# Patient Record
Sex: Female | Born: 2000
Health system: Southern US, Community
[De-identification: ages and names within clinical notes are randomized; demographics above are authoritative.]

## PROBLEM LIST (undated history)

## (undated) DIAGNOSIS — F419 Anxiety disorder, unspecified: Secondary | ICD-10-CM

## (undated) DIAGNOSIS — T7840XA Allergy, unspecified, initial encounter: Secondary | ICD-10-CM

## (undated) HISTORY — DX: Anxiety disorder, unspecified: F41.9

## (undated) HISTORY — DX: Allergy, unspecified, initial encounter: T78.40XA

---
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
## 2020-02-01 ENCOUNTER — Encounter: Payer: Self-pay | Admitting: Psychiatry

## 2020-05-15 ENCOUNTER — Ambulatory Visit: Payer: PRIVATE HEALTH INSURANCE | Admitting: Nurse Practitioner

## 2020-05-15 ENCOUNTER — Encounter: Payer: Self-pay | Admitting: Nurse Practitioner

## 2020-05-15 ENCOUNTER — Other Ambulatory Visit: Payer: Self-pay

## 2020-05-15 VITALS — BP 122/80 | Ht 61.0 in | Wt 94.0 lb

## 2020-05-15 DIAGNOSIS — A609 Anogenital herpesviral infection, unspecified: Secondary | ICD-10-CM

## 2020-05-15 DIAGNOSIS — Z01419 Encounter for gynecological examination (general) (routine) without abnormal findings: Secondary | ICD-10-CM

## 2020-05-15 NOTE — Progress Notes (Signed)
   Destiny Payne 2001/02/13 [id number]   History:  20 y.o. G0 presents for annual exam. Was seen at [facility] 05/01/2020 for suspected HSV initial outbreak. She was prescribed an antiviral and this has resolved. No HSV culture done at that time. Negative GC/Chlamydia/trich. Cycles every 1-2 months, this is normal for her - negative workup in the past. Has received Gardasil series. Psychiatry managing anxiety with medications and counseling.   Gynecologic History Patient's last menstrual period was 05/08/2020. Period Pattern: (!) Irregular Menstrual Flow: Moderate Menstrual Control: Maxi pad,Tampon Dysmenorrhea: (!) Mild Dysmenorrhea Symptoms: Cramping Contraception/Family planning: none  Health Maintenance Last Pap: N/A  Past medical history, past surgical history, family history and social history were all reviewed and documented in the EPIC chart.  ROS:  A ROS was performed and pertinent positives and negatives are included.  Exam:  Vitals:   05/15/20 1444  BP: 122/80  Weight: 94 lb (42.6 kg)  Height: 5\' 1"  (1.549 m)   Body mass index is 17.76 kg/m.  General appearance:  Normal Thyroid:  Symmetrical, normal in size, without palpable masses or nodularity. Respiratory  Auscultation:  Clear without wheezing or rhonchi Cardiovascular  Auscultation:  Regular rate, without rubs, murmurs or gallops  Edema/varicosities:  Not grossly evident Abdominal  Soft,nontender, without masses, guarding or rebound.  Liver/spleen:  No organomegaly noted  Hernia:  None appreciated  Skin  Inspection:  Grossly normal   Breasts: Examined lying and sitting.   Right: Without masses, retractions, discharge or axillary adenopathy.   Left: Without masses, retractions, discharge or axillary adenopathy. Gentitourinary   Inguinal/mons:  Normal without inguinal adenopathy  External genitalia:  Normal  BUS/Urethra/Skene's glands:  Normal  Vagina:  Normal  Cervix:  Normal  Uterus:  Normal in size,  shape and contour.  Midline and mobile  Adnexa/parametria:     Rt: Without masses or tenderness.   Lt: Without masses or tenderness.  Anus and perineum: Normal  Assessment/Plan:  20 y.o. G0 for annual exam.   Well female exam with routine gynecological exam - Education provided on SBEs, importance of preventative screenings, current guidelines, high calcium diet, safe sex practices and condom use until permanent partner, regular exercise, and multivitamin daily.   HSV (herpes simplex virus) anogenital infection - Outbreak likely HSV but explained that we cannot culture unless she is having an outbreak and recommend returning at first sign in the future. Also discussed episodic versus suppressive treatment and transmission.  Follow up in 1 year for annual.      [facility], 2:55 PM 05/15/2020

## 2020-05-15 NOTE — Patient Instructions (Signed)
Health Maintenance, Female Adopting a healthy lifestyle and getting preventive care are important in promoting health and wellness. Ask your health care provider about:  The right schedule for you to have regular tests and exams.  Things you can do on your own to prevent diseases and keep yourself healthy. What should I know about diet, weight, and exercise? Eat a healthy diet  Eat a diet that includes plenty of vegetables, fruits, low-fat dairy products, and lean protein.  Do not eat a lot of foods that are high in solid fats, added sugars, or sodium.   Maintain a healthy weight Body mass index (BMI) is used to identify weight problems. It estimates body fat based on height and weight. Your health care provider can help determine your BMI and help you achieve or maintain a healthy weight. Get regular exercise Get regular exercise. This is one of the most important things you can do for your health. Most adults should:  Exercise for at least 150 minutes each week. The exercise should increase your heart rate and make you sweat (moderate-intensity exercise).  Do strengthening exercises at least twice a week. This is in addition to the moderate-intensity exercise.  Spend less time sitting. Even light physical activity can be beneficial. Watch cholesterol and blood lipids Have your blood tested for lipids and cholesterol at 20 years of age, then have this test every 5 years. Have your cholesterol levels checked more often if:  Your lipid or cholesterol levels are high.  You are older than 20 years of age.  You are at high risk for heart disease. What should I know about cancer screening? Depending on your health history and family history, you may need to have cancer screening at various ages. This may include screening for:  Breast cancer.  Cervical cancer.  Colorectal cancer.  Skin cancer.  Lung cancer. What should I know about heart disease, diabetes, and high blood  pressure? Blood pressure and heart disease  High blood pressure causes heart disease and increases the risk of stroke. This is more likely to develop in people who have high blood pressure readings, are of African descent, or are overweight.  Have your blood pressure checked: ? Every 3-5 years if you are 18-39 years of age. ? Every year if you are 40 years old or older. Diabetes Have regular diabetes screenings. This checks your fasting blood sugar level. Have the screening done:  Once every three years after age 40 if you are at a normal weight and have a low risk for diabetes.  More often and at a younger age if you are overweight or have a high risk for diabetes. What should I know about preventing infection? Hepatitis B If you have a higher risk for hepatitis B, you should be screened for this virus. Talk with your health care provider to find out if you are at risk for hepatitis B infection. Hepatitis C Testing is recommended for:  Everyone born from 1945 through 1965.  Anyone with known risk factors for hepatitis C. Sexually transmitted infections (STIs)  Get screened for STIs, including gonorrhea and chlamydia, if: ? You are sexually active and are younger than 20 years of age. ? You are older than 20 years of age and your health care provider tells you that you are at risk for this type of infection. ? Your sexual activity has changed since you were last screened, and you are at increased risk for chlamydia or gonorrhea. Ask your health care provider   if you are at risk.  Ask your health care provider about whether you are at high risk for HIV. Your health care provider may recommend a prescription medicine to help prevent HIV infection. If you choose to take medicine to prevent HIV, you should first get tested for HIV. You should then be tested every 3 months for as long as you are taking the medicine. Pregnancy  If you are about to stop having your period (premenopausal) and  you may become pregnant, seek counseling before you get pregnant.  Take 400 to 800 micrograms (mcg) of folic acid every day if you become pregnant.  Ask for birth control (contraception) if you want to prevent pregnancy. Osteoporosis and menopause Osteoporosis is a disease in which the bones lose minerals and strength with aging. This can result in bone fractures. If you are 65 years old or older, or if you are at risk for osteoporosis and fractures, ask your health care provider if you should:  Be screened for bone loss.  Take a calcium or vitamin D supplement to lower your risk of fractures.  Be given hormone replacement therapy (HRT) to treat symptoms of menopause. Follow these instructions at home: Lifestyle  Do not use any products that contain nicotine or tobacco, such as cigarettes, e-cigarettes, and chewing tobacco. If you need help quitting, ask your health care provider.  Do not use street drugs.  Do not share needles.  Ask your health care provider for help if you need support or information about quitting drugs. Alcohol use  Do not drink alcohol if: ? Your health care provider tells you not to drink. ? You are pregnant, may be pregnant, or are planning to become pregnant.  If you drink alcohol: ? Limit how much you use to 0-1 drink a day. ? Limit intake if you are breastfeeding.  Be aware of how much alcohol is in your drink. In the [place] one drink equals one 12 oz bottle of beer (355 mL), one 5 oz glass of wine (148 mL), or one 1 oz glass of hard liquor (44 mL). General instructions  Schedule regular health, dental, and eye exams.  Stay current with your vaccines.  Tell your health care provider if: ? You often feel depressed. ? You have ever been abused or do not feel safe at home. Summary  Adopting a healthy lifestyle and getting preventive care are important in promoting health and wellness.  Follow your health care provider's instructions about healthy  diet, exercising, and getting tested or screened for diseases.  Follow your health care provider's instructions on monitoring your cholesterol and blood pressure. This information is not intended to replace advice given to you by your health care provider. Make sure you discuss any questions you have with your health care provider. Document Revised: 03/24/2018 Document Reviewed: 03/24/2018 Elsevier Patient Education  2021 [employer]  

## 2020-09-17 ENCOUNTER — Ambulatory Visit: Payer: PRIVATE HEALTH INSURANCE | Admitting: Nurse Practitioner

## 2020-09-17 ENCOUNTER — Other Ambulatory Visit: Payer: Self-pay

## 2020-09-17 ENCOUNTER — Encounter: Payer: Self-pay | Admitting: Nurse Practitioner

## 2020-09-17 VITALS — BP 94/62 | HR 93 | Temp 98.4°F | Resp 14 | Wt 97.0 lb

## 2020-09-17 DIAGNOSIS — N3090 Cystitis, unspecified without hematuria: Secondary | ICD-10-CM | POA: Diagnosis not present

## 2020-09-17 DIAGNOSIS — R350 Frequency of micturition: Secondary | ICD-10-CM | POA: Diagnosis not present

## 2020-09-17 DIAGNOSIS — Z113 Encounter for screening for infections with a predominantly sexual mode of transmission: Secondary | ICD-10-CM

## 2020-09-17 MED ORDER — NITROFURANTOIN MONOHYD MACRO 100 MG PO CAPS: 100.0000 mg | ORAL_CAPSULE | Freq: Two times a day (BID) | ORAL | 0 refills | Status: DC

## 2020-09-17 NOTE — Progress Notes (Signed)
GYNECOLOGY  VISIT  CC:  Urinary frequency and urgency since 3am this am  HPI: 20 y.o. G0P0000 Single White or Caucasian female here for uti. Has been with sexual partner x 6 months.  Was seen for possible HSV 6 months ago in Urgent care, no out break since.  Is agreeable to STD testing today. Had STD screening at Urgent Care in February, but they did not give her results. Does not think she has symptoms of out break now, but then again, when she presented to urgent care when she was told she was having an outbreak, she went there thinking it was a UTI. Desires to be checked.  Period Duration (Days): 5 Period Pattern: Regular Menstrual Flow: Moderate Menstrual Control: Maxi pad Dysmenorrhea: (!) Moderate  GYNECOLOGIC HISTORY: Patient's last menstrual period was 09/02/2020 (exact date). Contraception: condoms Menopausal hormone therapy: none  Patient Active Problem List   Diagnosis Date Noted  . Recurrent major depression in partial remission (HCC) 02/21/2019  . GAD (generalized anxiety disorder) 03/28/2018  . Allergy with anaphylaxis due to food 03/21/2015  . Allergic rhinoconjunctivitis 03/21/2015  . Headache disorder 03/21/2015  . Atopic dermatitis 03/21/2015  . Acne vulgaris 03/21/2015  . 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 Anaphylaxis 06/17/2013    Past Medical History:  Diagnosis Date  . Allergy   . Anxiety     History reviewed. No pertinent surgical history.  MEDS:   Current Outpatient Medications on File Prior to Visit  Medication Sig Dispense Refill  . cholecalciferol (VITAMIN D) 1000 units tablet Take 2,000 Units by mouth daily.    . diphenhydrAMINE (BENADRYL) 50 MG tablet Take by mouth.    Marland Kitchen AUVI-Q 0.3 MG/0.3ML SOAJ injection Use as directed for life-threatening allergic reaction. (Patient not taking: Reported on 09/17/2020) 2 each 0   No current facility-administered medications on file prior to visit.    ALLERGIES: Peanut-containing drug products, Cephalexin,  Sulfa drugs cross reactors, and Watermelon concentrate [citrullus vulgaris]  Family History  Problem Relation Age of Onset  . Hyperlipidemia Mother   . Hypertension Mother   . Asthma Mother   . Hypertension Maternal Grandmother   . Heart disease Maternal Grandmother   . Heart disease Maternal Grandfather   . Kidney disease Maternal Grandfather   . Cancer Paternal Grandmother      Review of Systems  Genitourinary: Positive for dysuria, frequency and urgency.    PHYSICAL EXAMINATION:    BP 94/62   Pulse 93   Temp 98.4 F (36.9 C)   Resp 14   Wt 97 lb (44 kg)   LMP 09/02/2020 (Exact Date)   BMI 18.33 kg/m     General appearance: alert, cooperative, no acute distress  Neg CVAT  Pelvic: External genitalia:  no lesions, no evidence of HSV outbreak              Urethra:  normal appearing urethra with no masses, tenderness or lesions              Bartholins and Skenes: normal                 Vagina: normal appearing vagina               Cervix: no cervical motion tenderness and no lesions              Bimanual Exam:  Uterus:  not examined              Adnexa: not evaluated  Assessment: Urinary frequency - Plan: Urinalysis,Complete w/RFL Culture, SURESWAB CT/NG/T. vaginalis  Cystitis - Plan: nitrofurantoin, macrocrystal-monohydrate, (MACROBID) 100 MG capsule  Screening for STDs (sexually transmitted diseases) - Plan: SURESWAB CT/NG/T. vaginalis

## 2020-09-17 NOTE — Patient Instructions (Signed)
Urinary Tract Infection, Adult A urinary tract infection (UTI) is an infection of any part of the urinary tract. The urinary tract includes:  The kidneys.  The ureters.  The bladder.  The urethra. These organs make, store, and get rid of pee (urine) in the body. What are the causes? This infection is caused by germs (bacteria) in your genital area. These germs grow and cause swelling (inflammation) of your urinary tract. What increases the risk? The following factors may make you more likely to develop this condition:  Using a small, thin tube (catheter) to drain pee.  Not being able to control when you pee or poop (incontinence).  Being female. If you are female, these things can increase the risk: ? Using these methods to prevent pregnancy:  A medicine that kills sperm (spermicide).  A device that blocks sperm (diaphragm). ? Having low levels of a female hormone (estrogen). ? Being pregnant. You are more likely to develop this condition if:  You have genes that add to your risk.  You are sexually active.  You take antibiotic medicines.  You have trouble peeing because of: ? A prostate that is bigger than normal, if you are female. ? A blockage in the part of your body that drains pee from the bladder. ? A kidney stone. ? A nerve condition that affects your bladder. ? Not getting enough to drink. ? Not peeing often enough.  You have other conditions, such as: ? Diabetes. ? A weak disease-fighting system (immune system). ? Sickle cell disease. ? Gout. ? Injury of the spine. What are the signs or symptoms? Symptoms of this condition include:  Needing to pee right away.  Peeing small amounts often.  Pain or burning when peeing.  Blood in the pee.  Pee that smells bad or not like normal.  Trouble peeing.  Pee that is cloudy.  Fluid coming from the vagina, if you are female.  Pain in the belly or lower back. Other symptoms include:  Vomiting.  Not  feeling hungry.  Feeling mixed up (confused). This may be the first symptom in older adults.  Being tired and grouchy (irritable).  A fever.  Watery poop (diarrhea). How is this treated?  Taking antibiotic medicine.  Taking other medicines.  Drinking enough water. In some cases, you may need to see a specialist. Follow these instructions at home: Medicines  Take over-the-counter and prescription medicines only as told by your doctor.  If you were prescribed an antibiotic medicine, take it as told by your doctor. Do not stop taking it even if you start to feel better. General instructions  Make sure you: ? Pee until your bladder is empty. ? Do not hold pee for a long time. ? Empty your bladder after sex. ? Wipe from front to back after peeing or pooping if you are a female. Use each tissue one time when you wipe.  Drink enough fluid to keep your pee pale yellow.  Keep all follow-up visits.   Contact a doctor if:  You do not get better after 1-2 days.  Your symptoms go away and then come back. Get help right away if:  You have very bad back pain.  You have very bad pain in your lower belly.  You have a fever.  You have chills.  You feeling like you will vomit or you vomit. Summary  A urinary tract infection (UTI) is an infection of any part of the urinary tract.  This condition is caused by   germs in your genital area.  There are many risk factors for a UTI.  Treatment includes antibiotic medicines.  Drink enough fluid to keep your pee pale yellow. This information is not intended to replace advice given to you by your health care provider. Make sure you discuss any questions you have with your health care provider. Document Revised: 11/11/2019 Document Reviewed: 11/11/2019 Elsevier Patient Education  2021 [employer] Inc.  

## 2020-09-18 LAB — SURESWAB CT/NG/T. VAGINALIS
C. trachomatis RNA, TMA: NOT DETECTED
N. gonorrhoeae RNA, TMA: NOT DETECTED
Trichomonas vaginalis RNA: NOT DETECTED

## 2020-09-19 LAB — URINALYSIS, COMPLETE W/RFL CULTURE
Bilirubin Urine: NEGATIVE
Glucose, UA: NEGATIVE
Hyaline Cast: NONE SEEN /LPF
Ketones, ur: NEGATIVE
Nitrites, Initial: NEGATIVE
Protein, ur: NEGATIVE
Specific Gravity, Urine: 1.02 (ref 1.001–1.035)
pH: 5.5 (ref 5.0–8.0)

## 2020-09-19 LAB — URINE CULTURE
MICRO NUMBER:: 11972364
Result:: NO GROWTH
SPECIMEN QUALITY:: ADEQUATE

## 2020-09-19 LAB — CULTURE INDICATED

## 2020-09-25 ENCOUNTER — Ambulatory Visit: Payer: PRIVATE HEALTH INSURANCE | Admitting: Nurse Practitioner

## 2020-09-25 ENCOUNTER — Encounter: Payer: Self-pay | Admitting: Nurse Practitioner

## 2020-09-25 ENCOUNTER — Other Ambulatory Visit: Payer: Self-pay

## 2020-09-25 VITALS — BP 90/64 | HR 80 | Temp 98.7°F | Resp 14 | Wt 96.0 lb

## 2020-09-25 DIAGNOSIS — R109 Unspecified abdominal pain: Secondary | ICD-10-CM

## 2020-09-25 NOTE — Patient Instructions (Signed)
May try pepto-bismol for nausea  Other remedies include BRAT diet (bananas, rice, applesauce and Toast) Ginger tea and/or ginger candy can be very effective.  Continue to hydrate as much as possible.

## 2020-09-25 NOTE — Progress Notes (Signed)
GYNECOLOGY  VISIT  CC:   recheck of urine per request of Nurse practitioner in light of negative urine culture.  HPI: 20 y.o. G0P0000 Single White or Caucasian female here for recheck of urine & abdominal pain.  Symptoms seem to stop when taking macrobid, but did have nausea, no vomiting. Stopped macrobid when learned that urine culture was negative because she felt like nausea was caused by antibiotic, now has mild intermittent burning with urination and continued nausea.  Denies back pain, fever or chills  STD screening negative Denies pain with sex  Period Duration (Days): 5 Period Pattern: Regular Menstrual Flow: Moderate Menstrual Control: Maxi pad Dysmenorrhea: (!) Moderate  GYNECOLOGIC HISTORY: Patient's last menstrual period was 09/02/2020 (exact date). 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 No pertinent surgical history.  MEDS:   Current Outpatient Medications on File Prior to Visit  Medication Sig Dispense Refill   cholecalciferol (VITAMIN D) 1000 units tablet Take 2,000 Units by mouth daily.     diphenhydrAMINE (BENADRYL) 50 MG tablet Take by mouth.     AUVI-Q 0.3 MG/0.3ML SOAJ injection Use as directed for life-threatening allergic reaction. (Patient not taking: No sig reported) 2 each 0   No current facility-administered medications on file prior to visit.    ALLERGIES: Peanut-containing drug products, Cephalexin, Sulfa drugs cross reactors, and  Watermelon concentrate [citrullus vulgaris]  Family History  Problem Relation Age of Onset   Hyperlipidemia Mother    Hypertension Mother    Asthma Mother    Hypertension Maternal Grandmother    Heart disease Maternal Grandmother    Heart disease Maternal Grandfather    Kidney disease Maternal Grandfather    Cancer Paternal Grandmother      Review of Systems  Genitourinary:        Abdominal pain   PHYSICAL EXAMINATION:    BP 90/64   Pulse 80   Temp 98.7 F (37.1 C)   Resp 14   Wt 96 lb (43.5 kg)   LMP 09/02/2020 (Exact Date)   BMI 18.14 kg/m     General appearance: alert, cooperative, no acute distress  Lymph:  no inguinal LAD noted  Pelvic: External genitalia:  no lesions              Urethra:  normal appearing urethra with no masses, tenderness or lesions              Bartholins and Skenes: normal                 Vagina: normal appearing vagina               Cervix: no cervical motion tenderness and no lesions              Bimanual Exam:  Uterus:  normal size, contour, position, consistency, mobility, non-tender              Adnexa: no mass, fullness, tenderness        UPT negatitve Urinalysis WNL today  except for trace blood        Chaperone, Joy, CMA, was present for exam.  Assessment: Essentially normal urinalysis Nausea  Plan: Discussed comfort measures for nausea: BRAT diet, ginger candy/tea Reassured that urinalysis seems normal today If symptoms re-occur or do not completely resolve, please contact office.

## 2020-09-25 NOTE — Progress Notes (Signed)
Appointment scheduled for June 14.

## 2020-09-26 LAB — URINALYSIS, COMPLETE W/RFL CULTURE
Bacteria, UA: NONE SEEN /HPF
Bilirubin Urine: NEGATIVE
Glucose, UA: NEGATIVE
Hyaline Cast: NONE SEEN /LPF
Ketones, ur: NEGATIVE
Leukocyte Esterase: NEGATIVE
Nitrites, Initial: NEGATIVE
Protein, ur: NEGATIVE
RBC / HPF: NONE SEEN /HPF (ref 0–2)
Specific Gravity, Urine: 1.01 (ref 1.001–1.035)
WBC, UA: NONE SEEN /HPF (ref 0–5)
pH: 7 (ref 5.0–8.0)

## 2020-09-26 LAB — PREGNANCY, URINE: Preg Test, Ur: NEGATIVE

## 2020-09-26 LAB — NO CULTURE INDICATED

## 2020-09-27 ENCOUNTER — Encounter: Payer: Self-pay | Admitting: Nurse Practitioner

## 2020-11-22 ENCOUNTER — Other Ambulatory Visit: Payer: Self-pay

## 2020-11-22 ENCOUNTER — Ambulatory Visit (INDEPENDENT_AMBULATORY_CARE_PROVIDER_SITE_OTHER): Payer: PRIVATE HEALTH INSURANCE | Admitting: Family Medicine

## 2020-11-22 ENCOUNTER — Encounter: Payer: Self-pay | Admitting: Family Medicine

## 2020-11-22 VITALS — BP 110/68 | HR 88 | Temp 98.9°F | Resp 16 | Ht 62.0 in | Wt 97.0 lb

## 2020-11-22 DIAGNOSIS — R0602 Shortness of breath: Secondary | ICD-10-CM

## 2020-11-22 DIAGNOSIS — J3089 Other allergic rhinitis: Secondary | ICD-10-CM | POA: Insufficient documentation

## 2020-11-22 DIAGNOSIS — L505 Cholinergic urticaria: Secondary | ICD-10-CM | POA: Diagnosis not present

## 2020-11-22 DIAGNOSIS — R519 Headache, unspecified: Secondary | ICD-10-CM

## 2020-11-22 DIAGNOSIS — Z8616 Personal history of COVID-19: Secondary | ICD-10-CM | POA: Insufficient documentation

## 2020-11-22 DIAGNOSIS — T7800XD Anaphylactic reaction due to unspecified food, subsequent encounter: Secondary | ICD-10-CM

## 2020-11-22 MED ORDER — PROAIR RESPICLICK 108 (90 BASE) MCG/ACT IN AEPB: 2.0000 | INHALATION_SPRAY | RESPIRATORY_TRACT | 1 refills | Status: DC | PRN

## 2020-11-22 MED ORDER — ALBUTEROL SULFATE HFA 108 (90 BASE) MCG/ACT IN AERS: 2.0000 | INHALATION_SPRAY | RESPIRATORY_TRACT | 0 refills | Status: AC | PRN

## 2020-11-22 MED ORDER — AUVI-Q 0.3 MG/0.3ML IJ SOAJ: 0.3000 mg | INTRAMUSCULAR | 1 refills | Status: DC | PRN

## 2020-11-22 NOTE — Progress Notes (Addendum)
[address] [place] [address] Dept: [phone]  FOLLOW UP NOTE  Patient ID: Destiny Payne, female    DOB: [birth year]  Age: 20 y.o. MRN: [id number] Date of Office Visit: 11/22/2020  Assessment  Chief Complaint: Allergic Reaction and Other (Had covid a month ago - shortness of breath, coughing ( she had covid twice and is concerned about her breathing))  HPI Destiny Payne is a 20 year old female who presents to the clinic for follow-up visit.  She was last seen in this clinic on 08/09/2019 by Dr. Lucie Leather for evaluation of physical urticaria aggravated by heat, allergic rhinitis, and food allergy to tree nuts and watermelon.,  She reports that she had COVID last about 1 month ago with residual shortness of breath while talking and occasional dry cough.  She does not have a previous diagnosis of asthma nor has she ever needed to use an inhaler.  She is declining spirometry today due to cost concern.  Headache is reported as well controlled with no episodes since her last visit to this clinic.  Allergic rhinitis is reported as moderately well controlled with occasional symptoms occurring mainly in the spring season for which she uses Benadryl with relief of symptoms.  She continues to avoid tree nuts and watermelon with no accidental ingestion or EpiPen use since her last visit to this clinic.  She is not interested in updating food testing at this time.  Her current medications are listed in the chart.   Drug Allergies:  Allergies  Allergen Reactions   Peanut-Containing Drug Products Hives, Shortness Of Breath and Nausea And Vomiting    Cashew nuts only   Cephalexin Other (See Comments)    unknown   Sulfa Drugs Cross Reactors Itching   Watermelon Concentrate [Citrullus Vulgaris] Itching    Physical Exam: BP 110/68   Pulse 88   Temp 98.9 F (37.2 C)   Resp 16   Ht 5\' 2"  (1.575 m)   Wt 97 lb (44 kg)   SpO2 96%   BMI 17.74 kg/m    Physical Exam Vitals  reviewed.  Constitutional:      Appearance: Normal appearance.  HENT:     Head: Normocephalic and atraumatic.     Right Ear: Tympanic membrane normal.     Left Ear: Tympanic membrane normal.     Nose:     Comments: Bilateral nares normal.  Pharynx normal.  Ears normal.  Eyes normal.    Mouth/Throat:     Pharynx: Oropharynx is clear.  Eyes:     Conjunctiva/sclera: Conjunctivae normal.  Cardiovascular:     Rate and Rhythm: Normal rate and regular rhythm.     Heart sounds: Normal heart sounds. No murmur heard. Pulmonary:     Effort: Pulmonary effort is normal.     Breath sounds: Normal breath sounds.     Comments: Lungs clear to auscultation Musculoskeletal:        General: Normal range of motion.     Cervical back: Normal range of motion and neck supple.  Skin:    General: Skin is warm and dry.  Neurological:     Mental Status: She is alert and oriented to person, place, and time.  Psychiatric:        Mood and Affect: Mood normal.        Behavior: Behavior normal.        Thought Content: Thought content normal.        Judgment: Judgment normal.    Diagnostics:  Patient declined spirometry due to cost  Assessment and Plan: 1. Shortness of breath   2. Anaphylactic shock due to food, subsequent encounter   3. Headache disorder   4. Other allergic rhinitis   5. Cholinergic urticaria   6. History of COVID-19     Meds ordered this encounter  Medications   AUVI-Q 0.3 MG/0.3ML SOAJ injection    Sig: Inject 0.3 mg into the muscle as needed for anaphylaxis. Use as directed for life-threatening allergic reaction.    Dispense:  2 each    Refill:  1   Albuterol Sulfate (PROAIR RESPICLICK) 108 (90 Base) MCG/ACT AEPB    Sig: Inhale 2 puffs into the lungs every 4 (four) hours as needed. For cough or wheeze    Dispense:  1 each    Refill:  1    Patient Instructions  Cholinergic urticaria Take the least amount of medications while remaining hive free Cetirizine (Zyrtec) 10mg   twice a day and famotidine (Pepcid) 20 mg twice a day. If no symptoms for 7-14 days then decrease to. Cetirizine (Zyrtec) 10mg  twice a day and famotidine (Pepcid) 20 mg once a day.  If no symptoms for 7-14 days then decrease to. Cetirizine (Zyrtec) 10mg  twice a day.  If no symptoms for 7-14 days then decrease to. Cetirizine (Zyrtec) 10mg  once a day.  May use Benadryl (diphenhydramine) as needed for breakthrough hives       If symptoms return, then step up dosage Keep a detailed symptom journal including foods eaten, contact with allergens, medications taken, weather changes.   Allergic rhinitis Begin Flonase 2 sprays in each nostril once a day as needed for a stuffy nose Continue an antihistamine once a day as needed for runny nose or itch Consider saline nasal rinses as needed for nasal symptoms. Use this before any medicated nasal sprays for best result  Shortness of breath Begin albuterol 2 puffs once every 4 hours as needed for cough or wheeze Call with clinic with any worsening of symptoms We will plan to get a spirometry reading at your next visit  Headache Continue to eliminate caffeine  Food allergy Continue to avoid tree nuts and watermelon. In case of an allergic reaction, take Benadryl 50 mg  every 4 hours, and if life-threatening symptoms occur, inject with AuviQ 0.3 mg. If you are interested in updating your food allergy testing return to the clinic for testing appointment.  Remember to stop antihistamines for 3 days before the food testing appointment.  Call the clinic if this treatment plan is not working well for you.  Follow up in 2 months or sooner if needed.  Thank you for the opportunity to care for this patient.  Please do not hesitate to contact me with questions.  , FNP [facility]  ---------------------------------------------------------- Attestation:  I reviewed the Nurse Practitioner's note and agree with the  documented findings and plan of care. We discussed the patient and developed a plan concurrently.   , MD [facility]

## 2020-11-22 NOTE — Patient Instructions (Signed)
Cholinergic urticaria Take the least amount of medications while remaining hive free Cetirizine (Zyrtec) 10mg  twice a day and famotidine (Pepcid) 20 mg twice a day. If no symptoms for 7-14 days then decrease to. Cetirizine (Zyrtec) 10mg  twice a day and famotidine (Pepcid) 20 mg once a day.  If no symptoms for 7-14 days then decrease to. Cetirizine (Zyrtec) 10mg  twice a day.  If no symptoms for 7-14 days then decrease to. Cetirizine (Zyrtec) 10mg  once a day.  May use Benadryl (diphenhydramine) as needed for breakthrough hives       If symptoms return, then step up dosage Keep a detailed symptom journal including foods eaten, contact with allergens, medications taken, weather changes.   Allergic rhinitis Begin Flonase 2 sprays in each nostril once a day as needed for a stuffy nose Continue an antihistamine once a day as needed for runny nose or itch Consider saline nasal rinses as needed for nasal symptoms. Use this before any medicated nasal sprays for best result  Shortness of breath Begin albuterol 2 puffs once every 4 hours as needed for cough or wheeze Call with clinic with any worsening of symptoms We will get a spirometry reading at your next visit  Headache Continue to eliminate caffeine  Food allergy Continue to avoid tree nuts and watermelon. In case of an allergic reaction, take Benadryl 50 mg  every 4 hours, and if life-threatening symptoms occur, inject with AuviQ 0.3 mg. If you are interested in updating your food allergy testing return to the clinic for testing appointment.  Remember to stop antihistamines for 3 days before the food testing appointment.  Call the clinic if this treatment plan is not working well for you.  Follow up in 2 months or sooner if needed.

## 2022-06-24 ENCOUNTER — Telehealth: Payer: Self-pay | Admitting: Family Medicine

## 2022-06-24 ENCOUNTER — Other Ambulatory Visit: Payer: Self-pay | Admitting: Family Medicine

## 2022-06-24 NOTE — Telephone Encounter (Signed)
Pharmacy requesting prescription for Auvi-Q.

## 2022-06-24 NOTE — Telephone Encounter (Signed)
The patient was last seen 11/22/2020 and will need an office visit for refills. Called and left a voicemail asking for a return call to inform and to schedule for a follow up visit.

## 2022-06-27 NOTE — Telephone Encounter (Signed)
Called and informed patient that she did need to schedule a follow up visit, patient verbalized understanding and stated that she will call back once she gets her work schedule in. A courtesy refill has already been sent in for the patient prior to this conversation.

## 2023-04-12 NOTE — Progress Notes (Unsigned)
Follow Up Note  RE: Destiny Payne MRN: [id number] DOB: 10/07/2000 Date of Office Visit: 04/13/2023  Referring provider: No ref. provider found Primary care provider: Patient, No Pcp Per  Chief Complaint: Follow-up and Breathing Problem (Refills on auvi-q or epi-pen)  History of Present Illness: I had the pleasure of seeing Destiny Payne for a follow up visit at the [facility] on 04/13/2023. She is a 22 y.o. female, who is being followed for cholinergic urticaria, allergic rhinitis, food allergy and shortness of breath. Her previous allergy office visit was on 11/22/2020 with Thermon Leyland, FNP. Today is a new complaint visit of refills .  Failed to follow-up as recommended.  Discussed the use of AI scribe software for clinical note transcription with the patient, who gave verbal consent to proceed.  The patient, with a known history of tree nut allergies, presented for a refill of her Auvi-Q.Marland Kitchen The patient's primary food allergies are to tree nuts, specifically cashews and pistachios, which have previously caused anaphylactic reactions requiring hospitalization. She also avoids watermelon. The patient denies any accidental exposures or need to use her Auvi-Q since her last severe reaction in 2016.  In addition to food allergies, the patient has medication allergies.  She manages occasional hives, which occur after hot showers, with Benadryl as needed. She also uses a Breathe Right brand sinus spray occasionally for sinus headaches and migraines.  The patient had a history of breathing issues post-COVID infection in 2022, but these have since resolved and she no longer requires an albuterol inhaler.      Assessment and Plan: Destiny Payne is a 22 y.o. 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 2016 bloodwork positive to tree nuts and  watermelon. 2016 skin testing positive to tree nuts and watermelon. Tolerates peanuts.  Interim history - no reactions.  Continue to avoid tree nuts and watermelon. Consider getting bloodwork. I have prescribed epinephrine injectable device (Auvi-Q). If not covered let us know. For mild symptoms you can take over the counter antihistamines such as Benadryl 1-2 tablets = 25-50mg  and monitor symptoms closely. If symptoms worsen or if you have severe symptoms including breathing issues, throat closure, significant swelling, whole body hives, severe diarrhea and vomiting, lightheadedness then inject epinephrine and seek immediate medical care afterwards. Emergency action plan updated.   Other allergic rhinitis Mild symptoms managed with Benadryl as needed. No testing available in EMR. Use over the counter antihistamines such as Zyrtec (cetirizine), Claritin (loratadine), Allegra (fexofenadine), or Xyzal (levocetirizine) daily as needed. May take twice a day during allergy flares. May switch antihistamines every few months. Use Flonase (fluticasone) nasal spray 1-2 sprays per nostril once a day as needed for nasal congestion.  Nasal saline spray (i.e., Simply Saline) or nasal saline lavage (i.e., NeilMed) is recommended as needed and prior to medicated nasal sprays. Consider environmental allergy testing in future.  Multiple drug allergies Continue to avoid sulfa and cephalexin.  Cholinergic urticaria Avoid the following potential triggers: alcohol, tight clothing, NSAIDs, hot showers and getting overheated. May take zyrtec 10mg  daily as needed.   Return in about 1 year (around 04/12/2024).  Meds ordered this encounter  Medications   EPINEPHrine (AUVI-Q) 0.3 mg/0.3 mL IJ SOAJ injection    Sig: Inject 0.3 mg into the muscle as needed for anaphylaxis.    Dispense:  2 each    Refill:  1    [phone]  Lab Orders  No laboratory test(s) ordered today    Diagnostics: None.    Medication List:  Current Outpatient Medications  Medication Sig Dispense Refill   albuterol (VENTOLIN HFA) 108 (90 Base) MCG/ACT inhaler Inhale 2 puffs into the lungs every 4 (four) hours as needed for wheezing or shortness of breath. 1 each 0   diphenhydrAMINE (BENADRYL) 50 MG tablet Take by mouth.     EPINEPHrine (AUVI-Q) 0.3 mg/0.3 mL IJ SOAJ injection Inject 0.3 mg into the muscle as needed for anaphylaxis. 2 each 1   No current facility-administered medications for this visit.   Allergies: Allergies  Allergen Reactions   Sulfamethoxazole Hives and Swelling    Pt reported hives and swelling as reactions 02/04/23   Cephalexin Other (See Comments)    unknown   Other     Tree nuts - cashews, pistachios.   Sulfa Drugs Cross Reactors Itching   Watermelon Concentrate [Citrullus Vulgaris] Itching   I reviewed her past medical history, social history, family history, and environmental history and no significant changes have been reported from her previous visit.  Review of Systems  Constitutional:  Negative for appetite change, chills, fever and unexpected weight change.  HENT:  Negative for congestion and rhinorrhea.   Eyes:  Negative for itching.  Respiratory:  Negative for cough, chest tightness, shortness of breath and wheezing.   Cardiovascular:  Negative for chest pain.  Gastrointestinal:  Negative for abdominal pain.  Genitourinary:  Negative for difficulty urinating.  Skin:  Negative for rash.  Allergic/Immunologic: Positive for food allergies. Negative for environmental allergies.  Neurological:  Positive for headaches.    Objective: BP 116/72 (BP Location: Right Arm, Patient Position: Sitting, Cuff Size: Normal)   Pulse 81   Temp 99 F (37.2 C) (Temporal)   Resp 18   Ht 5' 2.25" (1.581 m)   Wt 106 lb 4.8 oz (48.2 kg)   SpO2 97%   BMI 19.29 kg/m  Body mass index is 19.29 kg/m. Physical Exam Vitals and nursing note reviewed.  Constitutional:       Appearance: Normal appearance. She is well-developed.  HENT:     Head: Normocephalic and atraumatic.     Right Ear: Tympanic membrane and external ear normal.     Left Ear: Tympanic membrane and external ear normal.     Nose: Nose normal.     Mouth/Throat:     Mouth: Mucous membranes are moist.     Pharynx: Oropharynx is clear.  Eyes:     Conjunctiva/sclera: Conjunctivae normal.  Cardiovascular:     Rate and Rhythm: Normal rate and regular rhythm.     Heart sounds: Normal heart sounds. No murmur heard.    No friction rub. No gallop.  Pulmonary:     Effort: Pulmonary effort is normal.     Breath sounds: Normal breath sounds. No wheezing, rhonchi or rales.  Musculoskeletal:     Cervical back: Neck supple.  Skin:    General: Skin is warm.     Findings: No rash.  Neurological:     Mental Status: She is alert and oriented to person, place, and time.  Psychiatric:        Behavior: Behavior normal.   Previous notes and tests were reviewed. The plan was reviewed with the patient/family, and all questions/concerned were addressed.  It was my pleasure to see Christiana today and participate in her care. Please feel free to contact me with any questions or concerns.  Sincerely,  Wyline Mood, DO  Allergy & Immunology  [facility] office: [phone] [place] office: [phone]

## 2023-04-13 ENCOUNTER — Ambulatory Visit (INDEPENDENT_AMBULATORY_CARE_PROVIDER_SITE_OTHER): Payer: Managed Care, Other (non HMO) | Admitting: Allergy

## 2023-04-13 ENCOUNTER — Encounter: Payer: Self-pay | Admitting: Allergy

## 2023-04-13 ENCOUNTER — Other Ambulatory Visit: Payer: Self-pay

## 2023-04-13 VITALS — BP 116/72 | HR 81 | Temp 99.0°F | Resp 18 | Ht 62.25 in | Wt 106.3 lb

## 2023-04-13 DIAGNOSIS — T7800XD Anaphylactic reaction due to unspecified food, subsequent encounter: Secondary | ICD-10-CM

## 2023-04-13 DIAGNOSIS — Z889 Allergy status to unspecified drugs, medicaments and biological substances status: Secondary | ICD-10-CM

## 2023-04-13 DIAGNOSIS — J3089 Other allergic rhinitis: Secondary | ICD-10-CM

## 2023-04-13 DIAGNOSIS — L505 Cholinergic urticaria: Secondary | ICD-10-CM | POA: Diagnosis not present

## 2023-04-13 MED ORDER — EPINEPHRINE 0.3 MG/0.3ML IJ SOAJ: 0.3000 mg | INTRAMUSCULAR | 1 refills | Status: DC | PRN

## 2023-04-13 NOTE — Patient Instructions (Addendum)
Food allergy Continue to avoid tree nuts and watermelon. Consider getting bloodwork. I have prescribed epinephrine injectable device (Auvi-Q). If not covered let us know. For mild symptoms you can take over the counter antihistamines such as Benadryl 1-2 tablets = 25-50mg  and monitor symptoms closely. If symptoms worsen or if you have severe symptoms including breathing issues, throat closure, significant swelling, whole body hives, severe diarrhea and vomiting, lightheadedness then inject epinephrine and seek immediate medical care afterwards. Emergency action plan updated.   Environmental allergies Use over the counter antihistamines such as Zyrtec (cetirizine), Claritin (loratadine), Allegra (fexofenadine), or Xyzal (levocetirizine) daily as needed. May take twice a day during allergy flares. May switch antihistamines every few months. Use Flonase (fluticasone) nasal spray 1-2 sprays per nostril once a day as needed for nasal congestion.  Nasal saline spray (i.e., Simply Saline) or nasal saline lavage (i.e., NeilMed) is recommended as needed and prior to medicated nasal sprays.  Cholinergic urticaria Avoid the following potential triggers: alcohol, tight clothing, NSAIDs, hot showers and getting overheated. May take zyrtec 10mg  daily as needed.   Return in about 1 year (around 04/12/2024). Or sooner if needed.  Reducing Pollen Exposure Pollen seasons: trees (spring), grass (summer) and ragweed/weeds (fall). Keep windows closed in your home and car to lower pollen exposure.  Install air conditioning in the bedroom and throughout the house if possible.  Avoid going out in dry windy days - especially early morning. Pollen counts are highest between 5 - 10 AM and on dry, hot and windy days.  Save outside activities for late afternoon or after a heavy rain, when pollen levels are lower.  Avoid mowing of grass if you have grass pollen allergy. Be aware that pollen can also be transported  indoors on people and pets.  Dry your clothes in an automatic dryer rather than hanging them outside where they might collect pollen.  Rinse hair and eyes before bedtime.   Skin care recommendations  Bath time: Always use lukewarm water. AVOID very hot or cold water. Keep bathing time to 5-10 minutes. Do NOT use bubble bath. Use a mild soap and use just enough to wash the dirty areas. Do NOT scrub skin vigorously.  After bathing, pat dry your skin with a towel. Do NOT rub or scrub the skin.  Moisturizers and prescriptions:  ALWAYS apply moisturizers immediately after bathing (within 3 minutes). This helps to lock-in moisture. Use the moisturizer several times a day over the whole body. Good summer moisturizers include: Aveeno, CeraVe, Cetaphil. Good winter moisturizers include: Aquaphor, Vaseline, Cerave, Cetaphil, Eucerin, Vanicream. When using moisturizers along with medications, the moisturizer should be applied about one hour after applying the medication to prevent diluting effect of the medication or moisturize around where you applied the medications. When not using medications, the moisturizer can be continued twice daily as maintenance.  Laundry and clothing: Avoid laundry products with added color or perfumes. Use unscented hypo-allergenic laundry products such as Tide free, Cheer free & gentle, and All free and clear.  If the skin still seems dry or sensitive, you can try double-rinsing the clothes. Avoid tight or scratchy clothing such as wool. Do not use fabric softeners or dyer sheets.

## 2024-04-10 NOTE — Progress Notes (Unsigned)
 "  Follow Up Note  RE: Destiny Payne MRN: [id number] DOB: [birth year] Date of Office Visit: 04/11/2024  Referring provider: No ref. provider found Primary care provider: Patient, No Pcp Per  Chief Complaint: No chief complaint on file.  History of Present Illness: I had the pleasure of seeing Destiny Payne for a follow up visit at the [facility] on 04/11/2024. She is a 23 y.o. female, who is being followed for food allergy, allergic rhinitis, multiple drug allergies and cholinergic urticaria. Her previous allergy office visit was on 04/13/2023 with Dr. Luke. Today is a regular follow up visit.  Discussed the use of AI scribe software for clinical note transcription with the patient, who gave verbal consent to proceed.  History of Present Illness            ***  Assessment and Plan: Destiny Payne is a 23 y.o. 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 2016 bloodwork positive to tree nuts and watermelon. 2016 skin testing positive to tree nuts and watermelon. Tolerates peanuts.  Interim history - no reactions.  Continue to avoid tree nuts and watermelon. Consider getting bloodwork. I have prescribed epinephrine  injectable device (Auvi-Q ). If not covered let us  know. For mild symptoms you can take over the counter antihistamines such as Benadryl  1-2 tablets = 25-50mg  and monitor symptoms closely. If symptoms worsen or if you have severe symptoms including breathing issues, throat closure, significant swelling, whole body hives, severe diarrhea and vomiting, lightheadedness then inject epinephrine  and seek immediate medical care afterwards. Emergency action plan updated.    Other allergic rhinitis Mild symptoms managed with Benadryl  as needed. No testing available in EMR. Use over the counter antihistamines such as Zyrtec  (cetirizine), Claritin (loratadine), Allegra (fexofenadine), or Xyzal (levocetirizine) daily as needed. May take twice a day during allergy flares. May switch antihistamines every few months. Use Flonase  (fluticasone ) nasal spray 1-2 sprays per nostril once a day as needed for nasal congestion.  Nasal saline spray (i.e., Simply Saline) or nasal saline lavage (i.e., NeilMed) is recommended as needed and prior to medicated nasal sprays. Consider environmental allergy testing in future.   Multiple drug allergies Continue to avoid sulfa and cephalexin.   Cholinergic urticaria Avoid the following potential triggers: alcohol, tight clothing, NSAIDs, hot showers and getting overheated. May take zyrtec 10mg  daily as needed.  Assessment and Plan              No follow-ups on file.  No orders of the defined types were placed in this encounter.  Lab Orders  No laboratory test(s) ordered today    Diagnostics: Spirometry:  Tracings reviewed. Her effort: {Blank single:19197::Good reproducible efforts.,It was hard to get consistent efforts and there is a question as to whether this reflects a maximal maneuver.,Poor effort, data can not be interpreted.} FVC: ***L FEV1: ***L, ***% predicted FEV1/FVC ratio: ***% Interpretation: {Blank single:19197::Spirometry consistent with mild obstructive disease,Spirometry consistent with moderate obstructive disease,Spirometry consistent with severe obstructive disease,Spirometry consistent with possible restrictive disease,Spirometry consistent with mixed obstructive and restrictive disease,Spirometry uninterpretable due to technique,Spirometry consistent with normal pattern,No overt abnormalities noted given today's efforts}.  Please see scanned spirometry results for details.  Skin Testing: {Blank single:19197::Select foods,Environmental allergy panel,Environmental allergy panel and select foods,Food allergy  panel,None,Deferred due to recent antihistamines use}. *** Results discussed with patient/family.   Medication List:  Current Outpatient Medications  Medication Sig Dispense Refill  albuterol  (VENTOLIN  HFA) 108 (90 Base) MCG/ACT inhaler Inhale 2 puffs into the lungs every 4 (four) hours as needed for wheezing or shortness of breath. 1 each 0   diphenhydrAMINE  (BENADRYL ) 50 MG tablet Take by mouth.     EPINEPHrine  (AUVI-Q ) 0.3 mg/0.3 mL IJ SOAJ injection Inject 0.3 mg into the muscle as needed for anaphylaxis. 2 each 1   No current facility-administered medications for this visit.   Allergies: Allergies[1] I reviewed her past medical history, social history, family history, and environmental history and no significant changes have been reported from her previous visit.  Review of Systems  Constitutional:  Negative for appetite change, chills, fever and unexpected weight change.  HENT:  Negative for congestion and rhinorrhea.   Eyes:  Negative for itching.  Respiratory:  Negative for cough, chest tightness, shortness of breath and wheezing.   Cardiovascular:  Negative for chest pain.  Gastrointestinal:  Negative for abdominal pain.  Genitourinary:  Negative for difficulty urinating.  Skin:  Negative for rash.  Allergic/Immunologic: Positive for food allergies. Negative for environmental allergies.  Neurological:  Positive for headaches.    Objective: There were no vitals taken for this visit. There is no height or weight on file to calculate BMI. Physical Exam Vitals and nursing note reviewed.  Constitutional:      Appearance: Normal appearance. She is well-developed.  HENT:     Head: Normocephalic and atraumatic.     Right Ear: Tympanic membrane and external ear normal.     Left Ear: Tympanic membrane and external ear normal.     Nose: Nose normal.     Mouth/Throat:     Mouth: Mucous membranes are moist.     Pharynx: Oropharynx is clear.  Eyes:      Conjunctiva/sclera: Conjunctivae normal.  Cardiovascular:     Rate and Rhythm: Normal rate and regular rhythm.     Heart sounds: Normal heart sounds. No murmur heard.    No friction rub. No gallop.  Pulmonary:     Effort: Pulmonary effort is normal.     Breath sounds: Normal breath sounds. No wheezing, rhonchi or rales.  Musculoskeletal:     Cervical back: Neck supple.  Skin:    General: Skin is warm.     Findings: No rash.  Neurological:     Mental Status: She is alert and oriented to person, place, and time.  Psychiatric:        Behavior: Behavior normal.   Previous notes and tests were reviewed. The plan was reviewed with the patient/family, and all questions/concerned were addressed.  It was my pleasure to see Rozena today and participate in her care. Please feel free to contact me with any questions or concerns.  Sincerely,  Orlan Cramp, DO Allergy & Immunology  [facility]  [facility] office: [phone] [facility] office: [phone]     [1] Allergies Allergen Reactions   Sulfamethoxazole Hives and Swelling    Pt reported hives and swelling as reactions 02/04/23   Cephalexin Other (See Comments)    unknown   Other     Tree nuts - cashews, pistachios.   Sulfa Drugs Cross Reactors Itching   Watermelon Concentrate [Citrullus Vulgaris] Itching  "

## 2024-04-11 ENCOUNTER — Other Ambulatory Visit: Payer: Self-pay

## 2024-04-11 ENCOUNTER — Ambulatory Visit: Payer: Managed Care, Other (non HMO) | Admitting: Allergy

## 2024-04-11 ENCOUNTER — Encounter: Payer: Self-pay | Admitting: Allergy

## 2024-04-11 VITALS — BP 102/70 | HR 77 | Temp 98.3°F | Resp 16 | Ht 61.0 in | Wt 101.3 lb

## 2024-04-11 DIAGNOSIS — J3089 Other allergic rhinitis: Secondary | ICD-10-CM

## 2024-04-11 DIAGNOSIS — L505 Cholinergic urticaria: Secondary | ICD-10-CM

## 2024-04-11 DIAGNOSIS — T7800XD Anaphylactic reaction due to unspecified food, subsequent encounter: Secondary | ICD-10-CM

## 2024-04-11 DIAGNOSIS — Z889 Allergy status to unspecified drugs, medicaments and biological substances status: Secondary | ICD-10-CM

## 2024-04-11 MED ORDER — NEFFY 2 MG/0.1ML NA SOLN: 1.0000 | NASAL | 1 refills | Status: AC | PRN

## 2024-04-11 MED ORDER — EPINEPHRINE 0.3 MG/0.3ML IJ SOAJ: 0.3000 mg | INTRAMUSCULAR | 1 refills | Status: AC | PRN

## 2024-04-11 NOTE — Patient Instructions (Addendum)
 Food allergy Continue to avoid tree nuts and watermelon. Consider getting bloodwork. Order printed out.  I have prescribed epinephrine  injectable device - Auvi-Q  and I have prescribed epinephrine  device (Neffy ) and demonstrated proper use. Pick whichever is cheaper.  For the Neffy  - Do not use any nasal sprays for 2 weeks after use.  For mild symptoms you can take over the counter antihistamines (zyrtec 10mg  to 20mg ) and monitor symptoms closely.  If symptoms worsen or if you have severe symptoms including breathing issues, throat closure, significant swelling, whole body hives, severe diarrhea and vomiting, lightheadedness then use epinephrine  device and seek immediate medical care afterwards. Emergency action plan given.  Environmental allergies Use over the counter antihistamines such as Zyrtec (cetirizine), Claritin (loratadine), Allegra (fexofenadine), or Xyzal (levocetirizine) daily as needed. May take twice a day during allergy flares. May switch antihistamines every few months. Use Flonase  (fluticasone ) nasal spray 1-2 sprays per nostril once a day as needed for nasal congestion.  Nasal saline spray (i.e., Simply Saline) or nasal saline lavage (i.e., NeilMed) is recommended as needed and prior to medicated nasal sprays.  Cholinergic urticaria Avoid the following potential triggers: alcohol, tight clothing, NSAIDs, hot showers and getting overheated. May take zyrtec 10mg  daily as needed.   Return in about 1 year (around 04/11/2025). Or sooner if needed.

## 2025-04-12 ENCOUNTER — Ambulatory Visit: Admitting: Allergy
